# Patient Record
Sex: Female | Born: 2000
Health system: Southern US, Community
[De-identification: ages and names within clinical notes are randomized; demographics above are authoritative.]

## PROBLEM LIST (undated history)

## (undated) DIAGNOSIS — J45909 Unspecified asthma, uncomplicated: Secondary | ICD-10-CM

---
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
## 2022-06-26 ENCOUNTER — Ambulatory Visit: Payer: Federal, State, Local not specified - PPO | Admitting: Allergy

## 2022-06-26 DIAGNOSIS — J309 Allergic rhinitis, unspecified: Secondary | ICD-10-CM

## 2023-03-25 ENCOUNTER — Ambulatory Visit (INDEPENDENT_AMBULATORY_CARE_PROVIDER_SITE_OTHER): Payer: Federal, State, Local not specified - PPO | Admitting: Nurse Practitioner

## 2023-03-25 ENCOUNTER — Encounter: Payer: Self-pay | Admitting: Nurse Practitioner

## 2023-03-25 VITALS — BP 126/70 | HR 71 | Temp 98.3°F | Ht 67.0 in | Wt 239.0 lb

## 2023-03-25 DIAGNOSIS — Z91010 Allergy to peanuts: Secondary | ICD-10-CM

## 2023-03-25 DIAGNOSIS — J4520 Mild intermittent asthma, uncomplicated: Secondary | ICD-10-CM

## 2023-03-25 DIAGNOSIS — Z0000 Encounter for general adult medical examination without abnormal findings: Secondary | ICD-10-CM | POA: Diagnosis not present

## 2023-03-25 MED ORDER — ALBUTEROL SULFATE HFA 108 (90 BASE) MCG/ACT IN AERS
2.0000 | INHALATION_SPRAY | RESPIRATORY_TRACT | 1 refills | Status: DC | PRN
Start: 2023-03-25 — End: 2024-06-17

## 2023-03-25 MED ORDER — EPINEPHRINE 0.3 MG/0.3ML IJ SOAJ
0.3000 mg | INTRAMUSCULAR | 1 refills | Status: DC | PRN
Start: 2023-03-25 — End: 2024-06-17

## 2023-03-25 NOTE — Patient Instructions (Addendum)
Go to lab Continue Heart healthy diet and daily exercise. You are due to TDAP vaccine 07/2025.  Preventive Care 20-20 Years Old, Female Preventive care refers to lifestyle choices and visits with your health care provider that can promote health and wellness. At this stage in your life, you may start seeing a primary care physician instead of a pediatrician for your preventive care. Preventive care visits are also called wellness exams. What can I expect for my preventive care visit? Counseling During your preventive care visit, your health care provider may ask about your: Medical history, including: Past medical problems. Family medical history. Pregnancy history. Current health, including: Menstrual cycle. Method of birth control. Emotional well-being. Home life and relationship well-being. Sexual activity and sexual health. Lifestyle, including: Alcohol, nicotine or tobacco, and drug use. Access to firearms. Diet, exercise, and sleep habits. Sunscreen use. Motor vehicle safety. Physical exam Your health care provider may check your: Height and weight. These may be used to calculate your BMI (body mass index). BMI is a measurement that tells if you are at a healthy weight. Waist circumference. This measures the distance around your waistline. This measurement also tells if you are at a healthy weight and may help predict your risk of certain diseases, such as type 2 diabetes and high blood pressure. Heart rate and blood pressure. Body temperature. Skin for abnormal spots. Breasts. What immunizations do I need?  Vaccines are usually given at various ages, according to a schedule. Your health care provider will recommend vaccines for you based on your age, medical history, and lifestyle or other factors, such as travel or where you work. What tests do I need? Screening Your health care provider may recommend screening tests for certain conditions. This may include: Vision and  hearing tests. Lipid and cholesterol levels. Pelvic exam and Pap test. Hepatitis B test. Hepatitis C test. HIV (human immunodeficiency virus) test. STI (sexually transmitted infection) testing, if you are at risk. Tuberculosis skin test if you have symptoms. BRCA-related cancer screening. This may be done if you have a family history of breast, ovarian, tubal, or peritoneal cancers. Talk with your health care provider about your test results, treatment options, and if necessary, the need for more tests. Follow these instructions at home: Eating and drinking  Eat a healthy diet that includes fresh fruits and vegetables, whole grains, lean protein, and low-fat dairy products. Drink enough fluid to keep your urine pale yellow. Do not drink alcohol if: Your health care provider tells you not to drink. You are pregnant, may be pregnant, or are planning to become pregnant. You are under the legal drinking age. In the [place] the legal drinking age is 89. If you drink alcohol: Limit how much you have to 0-1 drink a day. Know how much alcohol is in your drink. In the [place] one drink equals one 12 oz bottle of beer (355 mL), one 5 oz glass of wine (148 mL), or one 1 oz glass of hard liquor (44 mL). Lifestyle Brush your teeth every morning and night with fluoride toothpaste. Floss one time each day. Exercise for at least 30 minutes 5 or more days of the week. Do not use any products that contain nicotine or tobacco. These products include cigarettes, chewing tobacco, and vaping devices, such as e-cigarettes. If you need help quitting, ask your health care provider. Do not use drugs. If you are sexually active, practice safe sex. Use a condom or other form of protection to prevent STIs. If you  do not wish to become pregnant, use a form of birth control. If you plan to become pregnant, see your health care provider for a prepregnancy visit. Find healthy ways to manage stress, such as: Meditation,  yoga, or listening to music. Journaling. Talking to a trusted person. Spending time with friends and family. Safety Always wear your seat belt while driving or riding in a vehicle. Do not drive: If you have been drinking alcohol. Do not ride with someone who has been drinking. When you are tired or distracted. While texting. If you have been using any mind-altering substances or drugs. Wear a helmet and other protective equipment during sports activities. If you have firearms in your house, make sure you follow all gun safety procedures. Seek help if you have been bullied, physically abused, or sexually abused. Use the internet responsibly to avoid dangers, such as online bullying and online sex predators. What's next? Go to your health care provider once a year for an annual wellness visit. Ask your health care provider how often you should have your eyes and teeth checked. Stay up to date on all vaccines. This information is not intended to replace advice given to you by your health care provider. Make sure you discuss any questions you have with your health care provider. Document Revised: 05/22/2021 Document Reviewed: 05/22/2021 Elsevier Patient Education  2023 [employer].

## 2023-03-25 NOTE — Progress Notes (Unsigned)
Complete physical exam  Patient: Katelyn Schroeder   DOB: February 19, 2001   22 y.o. Female  MRN: [id number] Visit Date: 03/26/2023  Subjective:    Chief Complaint  Patient presents with   Annual Exam    No concerns or questions   Adaleah Forget is a 22 y.o. female who presents today for a complete physical exam. She is a Archivist at [employer]. She plans to graduate 04/2025 with sociology degree. She also works part-time at [employer]. She reports consuming a general diet.  Exercise: walking 3x/week-30-2mins each time  She generally feels well. She reports sleeping well. She does not have additional problems to discuss today.  Vision:Yes Dental:Yes STD Screen:No  BP Readings from Last 3 Encounters:  03/25/23 126/70  09/17/21 106/68  12/26/20 110/66 (48 %, Z = -0.05 /  51 %, Z = 0.03)*   *BP percentiles are based on the 2017 AAP Clinical Practice Guideline for girls   Wt Readings from Last 3 Encounters:  03/25/23 239 lb (108.4 kg) (>99 %, Z= 2.41)*  09/17/21 222 lb 6.4 oz (100.9 kg) (99 %, Z= 2.23)*  12/26/20 (!) 234 lb 9.6 oz (106.4 kg) (>99 %, Z= 2.34)*   * Growth percentiles are based on CDC (Girls, 2-20 Years) data.   Most recent fall risk assessment:    03/25/2023    2:01 PM  Fall Risk   Falls in the past year? 0  Number falls in past yr: 0  Injury with Fall? 0  Risk for fall due to : No Fall Risks  Follow up Falls evaluation completed   Depression screen:Yes - No Depression  Most recent depression screenings:    03/25/2023    2:01 PM 09/17/2021    9:39 AM  PHQ 2/9 Scores  PHQ - 2 Score 0 0  PHQ- 9 Score 0 2   HPI  No problem-specific Assessment & Plan notes found for this encounter.   Past Medical History:  Diagnosis Date   Asthma    History reviewed. No pertinent surgical history. Social History   Socioeconomic History   Marital status: Single    Spouse name: Not on file   Number of children: Not on file   Years of education: Not on file    Highest education level: Not on file  Occupational History   Not on file  Tobacco Use   Smoking status: Never   Smokeless tobacco: Never  Vaping Use   Vaping Use: Never used  Substance and Sexual Activity   Alcohol use: No   Drug use: No   Sexual activity: Never  Other Topics Concern   Not on file  Social History Narrative   Not on file   Social Determinants of Health   Financial Resource Strain: Not on file  Food Insecurity: Not on file  Transportation Needs: Not on file  Physical Activity: Not on file  Stress: Not on file  Social Connections: Not on file  Intimate Partner Violence: Not on file   Family Status  Relation Name Status   MGM  (Not Specified)   MGF  (Not Specified)   PGM  (Not Specified)   Mother  Alive   Father  Alive   Family History  Problem Relation Age of Onset   Hypertension Maternal Grandmother    Hypertension Maternal Grandfather    Diabetes Paternal Grandmother    Arthritis Father    Hyperlipidemia Father    Allergies  Allergen Reactions   Peanut-Containing Drug Products  Anaphylaxis   Orange Fruit [Citrus] Rash and Other (See Comments)    Throat irritation    Patient Care Team: Rockey Guarino, Bonna Gains, NP as PCP - General (Internal Medicine)   Medications: Outpatient Medications Prior to Visit  Medication Sig   Spacer/Aero-Holding Chambers DEVI 1 Units by Does not apply route as needed.   [DISCONTINUED] albuterol (PROAIR HFA) 108 (90 Base) MCG/ACT inhaler Inhale 2 puffs into the lungs every 4 (four) hours as needed for wheezing or shortness of breath.   [DISCONTINUED] EPINEPHrine 0.3 mg/0.3 mL IJ SOAJ injection Inject 0.3 mg into the muscle as needed for anaphylaxis.   No facility-administered medications prior to visit.   Review of Systems  Constitutional:  Negative for activity change, appetite change and unexpected weight change.  Respiratory: Negative.    Cardiovascular: Negative.   Gastrointestinal: Negative.   Endocrine:  Negative for cold intolerance and heat intolerance.  Genitourinary: Negative.   Musculoskeletal: Negative.   Skin: Negative.   Neurological: Negative.   Hematological: Negative.   Psychiatric/Behavioral:  Negative for behavioral problems, decreased concentration, dysphoric mood, hallucinations, self-injury, sleep disturbance and suicidal ideas. The patient is not nervous/anxious.         Objective:  BP 126/70   Pulse 71   Temp 98.3 F (36.8 C) (Oral)   Ht  (1.702 m)   Wt 239 lb (108.4 kg)   SpO2 99%   BMI 37.43 kg/m     Physical Exam Vitals and nursing note reviewed.  Constitutional:      General: She is not in acute distress. HENT:     Right Ear: Tympanic membrane, ear canal and external ear normal.     Left Ear: Tympanic membrane, ear canal and external ear normal.     Nose: Nose normal.  Eyes:     Extraocular Movements: Extraocular movements intact.     Conjunctiva/sclera: Conjunctivae normal.     Pupils: Pupils are equal, round, and reactive to light.  Neck:     Thyroid: No thyroid mass, thyromegaly or thyroid tenderness.  Cardiovascular:     Rate and Rhythm: Normal rate and regular rhythm.     Pulses: Normal pulses.     Heart sounds: Normal heart sounds.  Pulmonary:     Effort: Pulmonary effort is normal.     Breath sounds: Normal breath sounds.  Abdominal:     General: Bowel sounds are normal.     Palpations: Abdomen is soft.  Genitourinary:    Comments: Deferred breast and pelvic exam to GYN Musculoskeletal:        General: Normal range of motion.     Cervical back: Normal range of motion and neck supple.     Right lower leg: No edema.     Left lower leg: No edema.  Lymphadenopathy:     Cervical: No cervical adenopathy.  Skin:    General: Skin is warm and dry.  Neurological:     Mental Status: She is alert and oriented to person, place, and time.     Cranial Nerves: No cranial nerve deficit.  Psychiatric:        Mood and Affect: Mood normal.         Behavior: Behavior normal.        Thought Content: Thought content normal.     No results found for any visits on 03/25/23.    Assessment & Plan:    Routine Health Maintenance and Physical Exam  Immunization History  Administered Date(s) Administered   DTaP  09/22/2003, 12/15/2003, 02/23/2004, 01/23/2005, 07/19/2007   HIB (PRP-OMP) 09/22/2003, 12/15/2003, 02/23/2004, 01/23/2005   HPV 9-valent 12/18/2017, 08/10/2020   Hepatitis A, Ped/Adol-2 Dose 10/10/2005, 09/04/2006   Hepatitis B, PED/ADOLESCENT 09-06-2003, 09/22/2003, 05/15/2004   Hpv-Unspecified 12/18/2017   IPV 09/22/2003, 12/15/2003, 05/15/2004, 07/19/2007   Influenza,inj,Quad PF,6+ Mos 09/17/2021   Influenza-Unspecified 09/04/2006, 11/07/2015, 09/17/2021   MMR 09/09/2004, 07/19/2007   Meningococcal Conjugate 07/27/2015, 08/10/2020   Meningococcal Mcv4o 08/10/2020   PFIZER(Purple Top)SARS-COV-2 Vaccination 05/01/2020, 05/22/2020   Pneumococcal Conjugate-13 09/22/2003, 12/15/2003, 05/15/2004, 01/23/2005   Td 07/27/2015   Tdap 07/27/2015   Varicella 09/09/2004, 07/19/2007   Health Maintenance  Topic Date Due   COVID-19 Vaccine (3 - 2023-24 season) 04/10/2023 (Originally 08/08/2022)   Hepatitis C Screening  03/24/2024 (Originally 07/16/2021)   HIV Screening  03/24/2024 (Originally 07/16/2018)   INFLUENZA VACCINE  07/09/2023   DTaP/Tdap/Td (8 - Td or Tdap) 07/26/2025   HPV VACCINES  Completed  Per NCRS last TDAP 07/2015: due 07/2025.  Discussed health benefits of physical activity, and encouraged her to engage in regular exercise appropriate for her age and condition.  Problem List Items Addressed This Visit       Respiratory   Mild intermittent asthma without complication   Relevant Medications   albuterol (PROAIR HFA) 108 (90 Base) MCG/ACT inhaler   Other Visit Diagnoses     Preventative health care    -  Primary   Relevant Orders   Comprehensive metabolic panel   CBC   Allergic to peanuts       Relevant  Medications   EPINEPHrine 0.3 mg/0.3 mL IJ SOAJ injection      Return in about 1 year (around 03/24/2024) for CPE (fasting).     Alysia Penna, NP

## 2023-03-26 ENCOUNTER — Telehealth: Payer: Self-pay | Admitting: Nurse Practitioner

## 2023-03-26 LAB — COMPREHENSIVE METABOLIC PANEL
ALT: 9 U/L (ref 0–35)
AST: 11 U/L (ref 0–37)
Albumin: 4.1 g/dL (ref 3.5–5.2)
Alkaline Phosphatase: 53 U/L (ref 47–119)
BUN: 9 mg/dL (ref 6–23)
CO2: 27 mEq/L (ref 19–32)
Calcium: 9.2 mg/dL (ref 8.4–10.5)
Chloride: 103 mEq/L (ref 96–112)
Creatinine, Ser: 0.82 mg/dL (ref 0.40–1.20)
GFR: 103.5 mL/min (ref >60.00)
Glucose, Bld: 74 mg/dL (ref 70–99)
Potassium: 3.7 mEq/L (ref 3.5–5.1)
Sodium: 138 mEq/L (ref 135–145)
Total Bilirubin: 0.4 mg/dL (ref 0.2–1.2)
Total Protein: 7 g/dL (ref 6.0–8.3)

## 2023-03-26 LAB — CBC
HCT: 36.9 % (ref 36.0–49.0)
Hemoglobin: 12.2 g/dL (ref 12.0–16.0)
MCHC: 33 g/dL (ref 31.0–37.0)
MCV: 83.5 fl (ref 78.0–98.0)
Platelets: 230 10*3/uL (ref 150.0–575.0)
RBC: 4.42 Mil/uL (ref 3.80–5.70)
RDW: 13 % (ref 11.4–15.5)
WBC: 8.1 10*3/uL (ref 4.5–13.5)

## 2023-03-26 NOTE — Telephone Encounter (Signed)
Letter sent.

## 2023-03-26 NOTE — Telephone Encounter (Signed)
Pt is needing a note stating she was seen by her doctor, Claris Gower yesterday 03/26/23. She would like this note to be uploaded to [employer].

## 2023-03-27 NOTE — Progress Notes (Signed)
Stable Follow instructions as discussed during office visit.

## 2023-11-20 ENCOUNTER — Other Ambulatory Visit: Payer: Self-pay

## 2023-11-20 ENCOUNTER — Emergency Department (HOSPITAL_BASED_OUTPATIENT_CLINIC_OR_DEPARTMENT_OTHER): Payer: Federal, State, Local not specified - PPO | Admitting: Radiology

## 2023-11-20 ENCOUNTER — Emergency Department (HOSPITAL_BASED_OUTPATIENT_CLINIC_OR_DEPARTMENT_OTHER)
Admission: EM | Admit: 2023-11-20 | Discharge: 2023-11-20 | Disposition: A | Discharge status: Home / Self Care | Payer: Federal, State, Local not specified - PPO | Attending: Emergency Medicine | Admitting: Emergency Medicine

## 2023-11-20 ENCOUNTER — Encounter (HOSPITAL_BASED_OUTPATIENT_CLINIC_OR_DEPARTMENT_OTHER): Payer: Self-pay

## 2023-11-20 ENCOUNTER — Emergency Department (HOSPITAL_BASED_OUTPATIENT_CLINIC_OR_DEPARTMENT_OTHER): Payer: Federal, State, Local not specified - PPO

## 2023-11-20 DIAGNOSIS — W108XXA Fall (on) (from) other stairs and steps, initial encounter: Secondary | ICD-10-CM | POA: Diagnosis not present

## 2023-11-20 DIAGNOSIS — S92355A Nondisplaced fracture of fifth metatarsal bone, left foot, initial encounter for closed fracture: Secondary | ICD-10-CM | POA: Diagnosis not present

## 2023-11-20 DIAGNOSIS — S99922A Unspecified injury of left foot, initial encounter: Secondary | ICD-10-CM | POA: Diagnosis not present

## 2023-11-20 DIAGNOSIS — Z91010 Allergy to peanuts: Secondary | ICD-10-CM | POA: Diagnosis not present

## 2023-11-20 NOTE — Discharge Instructions (Addendum)
Today you were seen for a 5th metatarsal fracture.  Dr. Thad Ranger with orthopedics will reach out to you Monday.  You may alternate taking Tylenol and Motrin as needed for pain.  Please do not take Motrin for greater than 5 days in a row as this may cause rebound headaches.  Thank you for letting us treat you today. After performing a physical exam and reviewing your imaging, I feel you are safe to go home. Please follow up with your PCP in the next several days and provide them with your records from this visit. Return to the Emergency Room if pain becomes severe or symptoms worsen.

## 2023-11-20 NOTE — ED Notes (Signed)
 Reviewed discharge instructions and home care with pt and mother. Pt verbalized understanding and had no further questions. Pt exited ED without complications.

## 2023-11-20 NOTE — ED Triage Notes (Signed)
Pt c/o L ankle pain after "twisting & falling when I stepped off the porch." CNS intact distal to injury

## 2023-11-20 NOTE — ED Provider Notes (Signed)
[facility] AT [facility] Provider Note   CSN: [id number] Arrival date & time: 11/20/23  0272     History  Chief Complaint  Patient presents with   Fall   Ankle Pain    L    Katelyn Schroeder is a 22 y.o. female presents today after tripping down the stairs.  Patient states her ankle supinated and she now has pain in her midfoot towards the lateral side.  Patient denies numbness or weakness.    Fall  Ankle Pain      Home Medications Prior to Admission medications   Medication Sig Start Date End Date Taking? Authorizing Provider  albuterol (PROAIR HFA) 108 (90 Base) MCG/ACT inhaler Inhale 2 puffs into the lungs every 4 (four) hours as needed for wheezing or shortness of breath. 03/25/23   Nche, Bonna Gains, NP  EPINEPHrine 0.3 mg/0.3 mL IJ SOAJ injection Inject 0.3 mg into the muscle as needed for anaphylaxis. 03/25/23   Nche, Bonna Gains, NP  Spacer/Aero-Holding Deretha Emory DEVI 1 Units by Does not apply route as needed. 09/17/21   Nche, Bonna Gains, NP      Allergies    Peanut-containing drug products and Orange fruit [citrus]    Review of Systems   Review of Systems  Musculoskeletal:  Positive for arthralgias.    Physical Exam Updated Vital Signs BP 117/61 (BP Location: Right Arm)   Pulse 74   Temp 98.5 F (36.9 C) (Oral)   Resp 16   LMP 11/20/2023   SpO2 100%  Physical Exam Vitals and nursing note reviewed.  Constitutional:      General: She is not in acute distress.    Appearance: She is well-developed.  HENT:     Head: Normocephalic and atraumatic.  Eyes:     Conjunctiva/sclera: Conjunctivae normal.  Cardiovascular:     Rate and Rhythm: Normal rate and regular rhythm.     Pulses: Normal pulses.     Heart sounds: Normal heart sounds. No murmur heard. Pulmonary:     Effort: Pulmonary effort is normal. No respiratory distress.     Breath sounds: Normal breath sounds.  Abdominal:     Palpations: Abdomen is soft.      Tenderness: There is no abdominal tenderness.  Musculoskeletal:        General: No swelling.     Cervical back: Neck supple.     Comments: Patient has tenderness to palpation at the base of the fifth metatarsal.  Patient has no posterior lateral or medial malleolus pain or navicular pain.  Patient is neurovascularly intact  Skin:    General: Skin is warm and dry.     Capillary Refill: Capillary refill takes less than 2 seconds.  Neurological:     Mental Status: She is alert.  Psychiatric:        Mood and Affect: Mood normal.     ED Results / Procedures / Treatments   Labs (all labs ordered are listed, but only abnormal results are displayed) Labs Reviewed - No data to display  EKG None  Radiology DG Ankle Complete Left Result Date: 11/20/2023 CLINICAL DATA:  Fall.  Left ankle pain. EXAM: LEFT ANKLE COMPLETE - 3+ VIEW COMPARISON:  None Available. FINDINGS: There is acute undisplaced fracture along the base of fifth metatarsal. No other acute fracture or dislocation. No aggressive osseous lesion. Ankle mortise appears intact. No focal soft tissue swelling. No radiopaque foreign bodies. IMPRESSION: *Avulsion fracture along the base of the fifth metatarsal. Electronically Signed  By: Jules Schick M.D.   On: 11/20/2023 19:31    Procedures Procedures    Medications Ordered in ED Medications - No data to display  ED Course/ Medical Decision Making/ A&P                                 Medical Decision Making Amount and/or Complexity of Data Reviewed Radiology: ordered.   This patient presents to the ED with chief complaint(s) of left foot injury with pertinent past medical history of none which further complicates the presenting complaint. The complaint involves an extensive differential diagnosis and also carries with it a high risk of complications and morbidity.    The differential diagnosis includes musculoskeletal pain, metatarsal fracture, fibular fracture, tibia  fracture  Additional history obtained: Additional history obtained from family  ED Course and Reassessment:   Independent visualization of imaging: - I independently visualized the following imaging with scope of interpretation limited to determining acute life threatening conditions related to emergency care: Left foot xray pending and ankle x-ray, which revealed avulsion fracture of the fifth metatarsal base  Consultation: - Consulted or discussed management/test interpretation w/ external professional: Orthopedics, Dr Thad Ranger who recommended cam walking boot and follow-up in clinic next week.  Consideration for admission or further workup: Consider for mission further workup however patient's vital signs, physical exam, and imaging of been reassuring.  Patient will be put in a walking boot and follow-up with Ortho sometime next week.        Final Clinical Impression(s) / ED Diagnoses Final diagnoses:  Closed nondisplaced fracture of fifth metatarsal bone of left foot, initial encounter    Rx / DC Orders ED Discharge Orders     None         Dolphus Jenny, PA-C 11/20/23 2023    Ernie Avena, MD 11/20/23 2056

## 2023-11-24 DIAGNOSIS — S92352A Displaced fracture of fifth metatarsal bone, left foot, initial encounter for closed fracture: Secondary | ICD-10-CM | POA: Diagnosis not present

## 2023-12-29 DIAGNOSIS — S92352A Displaced fracture of fifth metatarsal bone, left foot, initial encounter for closed fracture: Secondary | ICD-10-CM | POA: Diagnosis not present

## 2024-03-25 ENCOUNTER — Encounter: Payer: Federal, State, Local not specified - PPO | Admitting: Nurse Practitioner

## 2024-03-28 ENCOUNTER — Encounter: Payer: Federal, State, Local not specified - PPO | Admitting: Nurse Practitioner

## 2024-03-31 ENCOUNTER — Encounter: Payer: Federal, State, Local not specified - PPO | Admitting: Nurse Practitioner

## 2024-04-26 ENCOUNTER — Encounter: Admitting: Nurse Practitioner

## 2024-06-17 ENCOUNTER — Ambulatory Visit: Payer: Self-pay | Admitting: Nurse Practitioner

## 2024-06-17 ENCOUNTER — Encounter: Payer: Self-pay | Admitting: Nurse Practitioner

## 2024-06-17 ENCOUNTER — Ambulatory Visit (INDEPENDENT_AMBULATORY_CARE_PROVIDER_SITE_OTHER): Admitting: Nurse Practitioner

## 2024-06-17 VITALS — BP 124/78 | HR 93 | Temp 98.5°F | Ht 67.0 in | Wt 264.8 lb

## 2024-06-17 DIAGNOSIS — Z91010 Allergy to peanuts: Secondary | ICD-10-CM

## 2024-06-17 DIAGNOSIS — Z0000 Encounter for general adult medical examination without abnormal findings: Secondary | ICD-10-CM

## 2024-06-17 DIAGNOSIS — J4520 Mild intermittent asthma, uncomplicated: Secondary | ICD-10-CM

## 2024-06-17 DIAGNOSIS — E66813 Obesity, class 3: Secondary | ICD-10-CM

## 2024-06-17 DIAGNOSIS — Z6841 Body Mass Index (BMI) 40.0 and over, adult: Secondary | ICD-10-CM

## 2024-06-17 DIAGNOSIS — Z23 Encounter for immunization: Secondary | ICD-10-CM

## 2024-06-17 LAB — COMPREHENSIVE METABOLIC PANEL WITH GFR
ALT: 12 U/L (ref 0–35)
AST: 13 U/L (ref 0–37)
Albumin: 4.1 g/dL (ref 3.5–5.2)
Alkaline Phosphatase: 58 U/L (ref 39–117)
BUN: 8 mg/dL (ref 6–23)
CO2: 31 meq/L (ref 19–32)
Calcium: 9.3 mg/dL (ref 8.4–10.5)
Chloride: 105 meq/L (ref 96–112)
Creatinine, Ser: 0.78 mg/dL (ref 0.40–1.20)
GFR: 108.96 mL/min (ref >60.00)
Glucose, Bld: 92 mg/dL (ref 70–99)
Potassium: 3.6 meq/L (ref 3.5–5.1)
Sodium: 140 meq/L (ref 135–145)
Total Bilirubin: 0.4 mg/dL (ref 0.2–1.2)
Total Protein: 7.4 g/dL (ref 6.0–8.3)

## 2024-06-17 LAB — CBC
HCT: 37.9 % (ref 36.0–46.0)
Hemoglobin: 12.5 g/dL (ref 12.0–15.0)
MCHC: 33 g/dL (ref 30.0–36.0)
MCV: 81.6 fl (ref 78.0–100.0)
Platelets: 229 K/uL (ref 150.0–400.0)
RBC: 4.64 Mil/uL (ref 3.87–5.11)
RDW: 13.1 % (ref 11.5–14.6)
WBC: 9 K/uL (ref 4.5–10.5)

## 2024-06-17 LAB — LIPID PANEL
Cholesterol: 160 mg/dL (ref 0–200)
HDL: 56.4 mg/dL (ref >39.00)
LDL Cholesterol: 93 mg/dL (ref 0–99)
NonHDL: 103.98
Total CHOL/HDL Ratio: 3
Triglycerides: 53 mg/dL (ref 0.0–149.0)
VLDL: 10.6 mg/dL (ref 0.0–40.0)

## 2024-06-17 LAB — TSH: TSH: 1.23 u[IU]/mL (ref 0.35–5.50)

## 2024-06-17 MED ORDER — ALBUTEROL SULFATE HFA 108 (90 BASE) MCG/ACT IN AERS: 2.0000 | INHALATION_SPRAY | RESPIRATORY_TRACT | 0 refills | Status: AC | PRN

## 2024-06-17 MED ORDER — EPINEPHRINE 0.3 MG/0.3ML IJ SOAJ
0.3000 mg | INTRAMUSCULAR | 0 refills | Status: AC | PRN
Start: 2024-06-17 — End: ?

## 2024-06-17 NOTE — Patient Instructions (Signed)
 Go to lab Maintain Heart healthy diet and daily exercise.  Preventive Care 21-21 Years Old, Female Preventive care refers to lifestyle choices and visits with your health care provider that can promote health and wellness. At this stage in your life, you may start seeing a primary care physician instead of a pediatrician for your preventive care. Preventive care visits are also called wellness exams. What can I expect for my preventive care visit? Counseling During your preventive care visit, your health care provider may ask about your: Medical history, including: Past medical problems. Family medical history. Pregnancy history. Current health, including: Menstrual cycle. Method of birth control. Emotional well-being. Home life and relationship well-being. Sexual activity and sexual health. Lifestyle, including: Alcohol, nicotine or tobacco, and drug use. Access to firearms. Diet, exercise, and sleep habits. Sunscreen use. Motor vehicle safety. Physical exam Your health care provider may check your: Height and weight. These may be used to calculate your BMI (body mass index). BMI is a measurement that tells if you are at a healthy weight. Waist circumference. This measures the distance around your waistline. This measurement also tells if you are at a healthy weight and may help predict your risk of certain diseases, such as type 2 diabetes and high blood pressure. Heart rate and blood pressure. Body temperature. Skin for abnormal spots. Breasts. What immunizations do I need?  Vaccines are usually given at various ages, according to a schedule. Your health care provider will recommend vaccines for you based on your age, medical history, and lifestyle or other factors, such as travel or where you work. What tests do I need? Screening Your health care provider may recommend screening tests for certain conditions. This may include: Vision and hearing tests. Lipid and cholesterol  levels. Pelvic exam and Pap test. Hepatitis B test. Hepatitis C test. HIV (human immunodeficiency virus) test. STI (sexually transmitted infection) testing, if you are at risk. Tuberculosis skin test if you have symptoms. BRCA-related cancer screening. This may be done if you have a family history of breast, ovarian, tubal, or peritoneal cancers. Talk with your health care provider about your test results, treatment options, and if necessary, the need for more tests. Follow these instructions at home: Eating and drinking  Eat a healthy diet that includes fresh fruits and vegetables, whole grains, lean protein, and low-fat dairy products. Drink enough fluid to keep your urine pale yellow. Do not drink alcohol if: Your health care provider tells you not to drink. You are pregnant, may be pregnant, or are planning to become pregnant. You are under the legal drinking age. In the [place] the legal drinking age is 37. If you drink alcohol: Limit how much you have to 0-1 drink a day. Know how much alcohol is in your drink. In the [place] one drink equals one 12 oz bottle of beer (355 mL), one 5 oz glass of wine (148 mL), or one 1 oz glass of hard liquor (44 mL). Lifestyle Brush your teeth every morning and night with fluoride toothpaste. Floss one time each day. Exercise for at least 30 minutes 5 or more days of the week. Do not use any products that contain nicotine or tobacco. These products include cigarettes, chewing tobacco, and vaping devices, such as e-cigarettes. If you need help quitting, ask your health care provider. Do not use drugs. If you are sexually active, practice safe sex. Use a condom or other form of protection to prevent STIs. If you do not wish to become pregnant, use  a form of birth control. If you plan to become pregnant, see your health care provider for a prepregnancy visit. Find healthy ways to manage stress, such as: Meditation, yoga, or listening to  music. Journaling. Talking to a trusted person. Spending time with friends and family. Safety Always wear your seat belt while driving or riding in a vehicle. Do not drive: If you have been drinking alcohol. Do not ride with someone who has been drinking. When you are tired or distracted. While texting. If you have been using any mind-altering substances or drugs. Wear a helmet and other protective equipment during sports activities. If you have firearms in your house, make sure you follow all gun safety procedures. Seek help if you have been bullied, physically abused, or sexually abused. Use the internet responsibly to avoid dangers, such as online bullying and online sex predators. What's next? Go to your health care provider once a year for an annual wellness visit. Ask your health care provider how often you should have your eyes and teeth checked. Stay up to date on all vaccines. This information is not intended to replace advice given to you by your health care provider. Make sure you discuss any questions you have with your health care provider. Document Revised: 05/22/2021 Document Reviewed: 05/22/2021 Elsevier Patient Education  2024 [employer].

## 2024-06-17 NOTE — Progress Notes (Signed)
 Complete physical exam  Patient: Katelyn Schroeder   DOB: May 02, 2001   22 y.o. Female  MRN: [id number] Visit Date: 06/17/2024  Subjective:    Chief Complaint  Patient presents with   Annual Exam    Cpe. Pt is fasting. No Concerns.    Katelyn Schroeder is a 23 y.o. female who presents today for a complete physical exam. She reports consuming a general diet. No consistent exercise She generally feels well. She reports sleeping well. She does not have additional problems to discuss today.  Vision:Yes Dental:Yes and Within Last 6 months STD Screen:No  BP Readings from Last 3 Encounters:  06/17/24 124/78  11/20/23 117/61  03/25/23 126/70   Wt Readings from Last 3 Encounters:  06/17/24 264 lb 12.8 oz (120.1 kg)  03/25/23 239 lb (108.4 kg) (>99%, Z= 2.41)*  09/17/21 222 lb 6.4 oz (100.9 kg) (99%, Z= 2.23)*   * Growth percentiles are based on CDC (Girls, 2-20 Years) data.    Most recent fall risk assessment:    06/17/2024    1:15 PM  Fall Risk   Falls in the past year? 1  Number falls in past yr: 0  Injury with Fall? 1  Risk for fall due to : History of fall(s)  Follow up Falls evaluation completed   Depression screen:Yes - No Depression Most recent depression screenings:    06/17/2024    1:15 PM 03/25/2023    2:01 PM  PHQ 2/9 Scores  PHQ - 2 Score 1 0  PHQ- 9 Score 3 0   HPI  No problem-specific Assessment & Plan notes found for this encounter.   Past Medical History:  Diagnosis Date   Asthma    History reviewed. No pertinent surgical history. Social History   Socioeconomic History   Marital status: Single    Spouse name: Not on file   Number of children: Not on file   Years of education: Not on file   Highest education level: Not on file  Occupational History   Not on file  Tobacco Use   Smoking status: Never   Smokeless tobacco: Never  Vaping Use   Vaping status: Never Used  Substance and Sexual Activity   Alcohol use: No   Drug use: No   Sexual activity:  Never    Birth control/protection: Abstinence  Other Topics Concern   Not on file  Social History Narrative   Not on file   Social Drivers of Health   Financial Resource Strain: Not on file  Food Insecurity: Not on file  Transportation Needs: Not on file  Physical Activity: Not on file  Stress: Not on file  Social Connections: Not on file  Intimate Partner Violence: Not on file   Family Status  Relation Name Status   MGM  (Not Specified)   MGF  (Not Specified)   PGM  (Not Specified)   Mother  Alive   Father  Alive  No partnership data on file   Family History  Problem Relation Age of Onset   Hypertension Maternal Grandmother    Hypertension Maternal Grandfather    Diabetes Paternal Grandmother    Arthritis Father    Hyperlipidemia Father    Allergies  Allergen Reactions   Peanut-Containing Drug Products Anaphylaxis   Orange Fruit [Citrus] Rash and Other (See Comments)    Throat irritation    Patient Care Team: Shanna Un, Roselie Rockford, NP as PCP - General (Internal Medicine)   Medications: Outpatient Medications Prior to Visit  Medication  Sig   Spacer/Aero-Holding Chambers DEVI 1 Units by Does not apply route as needed.   [DISCONTINUED] albuterol  (PROAIR  HFA) 108 (90 Base) MCG/ACT inhaler Inhale 2 puffs into the lungs every 4 (four) hours as needed for wheezing or shortness of breath.   [DISCONTINUED] EPINEPHrine  0.3 mg/0.3 mL IJ SOAJ injection Inject 0.3 mg into the muscle as needed for anaphylaxis.   No facility-administered medications prior to visit.    Review of Systems  Constitutional:  Negative for activity change, appetite change and unexpected weight change.  Respiratory: Negative.    Cardiovascular: Negative.   Gastrointestinal: Negative.   Endocrine: Negative for cold intolerance and heat intolerance.  Genitourinary: Negative.   Musculoskeletal: Negative.   Skin: Negative.   Neurological: Negative.   Hematological: Negative.    Psychiatric/Behavioral:  Negative for behavioral problems, decreased concentration, dysphoric mood, hallucinations, self-injury, sleep disturbance and suicidal ideas. The patient is not nervous/anxious.         Objective:  BP 124/78   Pulse 93   Temp 98.5 F (36.9 C) (Temporal)   Ht 5' 7 (1.702 m)   Wt 264 lb 12.8 oz (120.1 kg)   LMP 05/30/2024   SpO2 98%   BMI 41.47 kg/m     Physical Exam Vitals and nursing note reviewed.  Constitutional:      General: She is not in acute distress. HENT:     Right Ear: Tympanic membrane, ear canal and external ear normal.     Left Ear: Tympanic membrane, ear canal and external ear normal.     Nose: Nose normal.  Eyes:     Extraocular Movements: Extraocular movements intact.     Conjunctiva/sclera: Conjunctivae normal.     Pupils: Pupils are equal, round, and reactive to light.  Neck:     Thyroid : No thyroid  mass, thyromegaly or thyroid  tenderness.  Cardiovascular:     Rate and Rhythm: Normal rate and regular rhythm.     Pulses: Normal pulses.     Heart sounds: Normal heart sounds.  Pulmonary:     Effort: Pulmonary effort is normal.     Breath sounds: Normal breath sounds.  Chest:  Breasts:    Breasts are symmetrical.  Abdominal:     General: Bowel sounds are normal.     Palpations: Abdomen is soft.  Musculoskeletal:        General: Normal range of motion.     Cervical back: Normal range of motion and neck supple.     Right lower leg: No edema.     Left lower leg: No edema.  Lymphadenopathy:     Cervical: No cervical adenopathy.     Upper Body:     Right upper body: No supraclavicular, axillary or pectoral adenopathy.     Left upper body: No supraclavicular, axillary or pectoral adenopathy.  Skin:    General: Skin is warm and dry.  Neurological:     Mental Status: She is alert and oriented to person, place, and time.     Cranial Nerves: No cranial nerve deficit.  Psychiatric:        Mood and Affect: Mood normal.         Behavior: Behavior normal.        Thought Content: Thought content normal.     No results found for any visits on 06/17/24.    Assessment & Plan:    Routine Health Maintenance and Physical Exam  Immunization History  Administered Date(s) Administered   DTaP 09/22/2003, 12/15/2003, 02/23/2004, 01/23/2005, 07/19/2007  HIB (PRP-OMP) 09/22/2003, 12/15/2003, 02/23/2004, 01/23/2005   HPV 9-valent 12/18/2017, 08/10/2020   Hepatitis A, Ped/Adol-2 Dose 10/10/2005, 09/04/2006   Hepatitis B, PED/ADOLESCENT 07/29/2003, 09/22/2003, 05/15/2004   Hpv-Unspecified 12/18/2017   IPV 09/22/2003, 12/15/2003, 05/15/2004, 07/19/2007   Influenza,inj,Quad PF,6+ Mos 09/17/2021   Influenza-Unspecified 09/04/2006, 11/07/2015, 09/17/2021   MMR 09/09/2004, 07/19/2007   Meningococcal B, OMV 06/17/2024   Meningococcal Conjugate 07/27/2015, 08/10/2020   Meningococcal Mcv4o 08/10/2020   PFIZER(Purple Top)SARS-COV-2 Vaccination 05/01/2020, 05/22/2020   PNEUMOCOCCAL CONJUGATE-20 06/17/2024   Pneumococcal Conjugate-13 09/22/2003, 12/15/2003, 05/15/2004, 01/23/2005   Td 07/27/2015   Tdap 07/27/2015   Varicella 09/09/2004, 07/19/2007   Health Maintenance  Topic Date Due   COVID-19 Vaccine (3 - 2024-25 season) 07/03/2024 (Originally 08/09/2023)   Hepatitis C Screening  06/17/2025 (Originally 07/16/2021)   HIV Screening  06/17/2025 (Originally 07/16/2018)   INFLUENZA VACCINE  07/08/2024   Meningococcal B Vaccine (2 of 2 - Bexsero SCDM 2-dose series) 12/18/2024   DTaP/Tdap/Td (8 - Td or Tdap) 07/26/2025   Pneumococcal Vaccine 7-67 Years old  Completed   Hepatitis B Vaccines  Completed   HPV VACCINES  Completed   Discussed health benefits of physical activity, and encouraged her to engage in regular exercise appropriate for her age and condition.  Problem List Items Addressed This Visit     Mild intermittent asthma without complication   Relevant Medications   albuterol  (PROAIR  HFA) 108 (90 Base) MCG/ACT inhaler    Other Visit Diagnoses       Preventative health care    -  Primary   Relevant Orders   CBC   Comprehensive metabolic panel with GFR     Allergic to peanuts       Relevant Medications   EPINEPHrine  0.3 mg/0.3 mL IJ SOAJ injection     Class 3 severe obesity due to excess calories without serious comorbidity with body mass index (BMI) of 40.0 to 44.9 in adult       Relevant Orders   Lipid panel   TSH     Immunization due       Relevant Orders   Meningococcal B, OMV (Bexsero) (Completed)   Pneumococcal conjugate vaccine 20-valent (Prevnar 20) (Completed)      Return in about 1 year (around 06/17/2025) for CPE (fasting).     Roselie Mood, NP
